# Patient Record
Sex: Male | Born: 1975 | Race: White | Hispanic: No | Marital: Single | Smoking: Former smoker
Health system: Southern US, Community
[De-identification: ages and names within clinical notes are randomized; demographics above are authoritative.]

## PROBLEM LIST (undated history)

## (undated) DIAGNOSIS — F39 Unspecified mood [affective] disorder: Secondary | ICD-10-CM

## (undated) DIAGNOSIS — M1990 Unspecified osteoarthritis, unspecified site: Secondary | ICD-10-CM

## (undated) HISTORY — DX: Unspecified mood (affective) disorder: F39

## (undated) HISTORY — PX: OTHER SURGICAL HISTORY: SHX169

## (undated) HISTORY — PX: TONSILLECTOMY: SUR1361

---

## 1998-01-24 ENCOUNTER — Other Ambulatory Visit: Admission: RE | Admit: 1998-01-24 | Discharge: 1998-01-24 | Payer: Self-pay | Admitting: *Deleted

## 2005-01-28 ENCOUNTER — Emergency Department (HOSPITAL_COMMUNITY): Admission: EM | Admit: 2005-01-28 | Discharge: 2005-01-28 | Payer: Self-pay | Admitting: Emergency Medicine

## 2010-09-10 ENCOUNTER — Other Ambulatory Visit (HOSPITAL_COMMUNITY)
Admission: RE | Admit: 2010-09-10 | Discharge: 2010-09-10 | Disposition: A | Discharge status: Home / Self Care | Payer: Self-pay | Source: Ambulatory Visit | Attending: Urology | Admitting: Urology

## 2010-09-10 ENCOUNTER — Other Ambulatory Visit: Payer: Self-pay | Admitting: Urology

## 2010-09-10 DIAGNOSIS — Z302 Encounter for sterilization: Secondary | ICD-10-CM | POA: Insufficient documentation

## 2019-07-14 ENCOUNTER — Encounter: Payer: Self-pay | Admitting: Gastroenterology

## 2019-08-11 ENCOUNTER — Ambulatory Visit: Payer: BC Managed Care – PPO | Admitting: Gastroenterology

## 2019-09-09 ENCOUNTER — Ambulatory Visit: Payer: BC Managed Care – PPO | Admitting: Gastroenterology

## 2019-09-09 ENCOUNTER — Encounter: Payer: Self-pay | Admitting: Gastroenterology

## 2019-09-09 ENCOUNTER — Other Ambulatory Visit: Payer: Self-pay

## 2019-09-09 DIAGNOSIS — R1013 Epigastric pain: Secondary | ICD-10-CM

## 2019-09-09 DIAGNOSIS — R0989 Other specified symptoms and signs involving the circulatory and respiratory systems: Secondary | ICD-10-CM | POA: Insufficient documentation

## 2019-09-09 DIAGNOSIS — R1310 Dysphagia, unspecified: Secondary | ICD-10-CM | POA: Insufficient documentation

## 2019-09-09 NOTE — Progress Notes (Signed)
Primary Care Physician:  Harold Weaver, [place] Primary Gastroenterologist:  Dr. Gala Weaver   Chief Complaint  Patient presents with  . Abdominal Pain    Sharp pain on left side through stomach and goes towards chest,trouble swallowing    HPI:   Harold Weaver is a 44 y.o. male presenting today at the request of Harold Begin, FNP, due to abdominal pain and dysphagia.   He reports chronic LUQ pain, GERD, and solid food/pill dysphagia for several years. No N/V. No regurgitation. LUQ pain underlying, waxing and waning. No specific postprandial relation. Has been on Protonix chronically but not taking on an empty stomach. Drinks energy drinks and sodas.   Night shift for 3 years. Works at [employer]. Threw everything off for him. Eats before taking medication. Never tried Prilosec or Nexium. Will take 800 mg Ibuprofen as needed for headaches. About once a week. No melena or hematochezia. Good appetite. No weight loss. Feels like he has to clear his throat all the time. Voice hoarseness. Feels like something is in his throat all the time. Throat clearing.   No constipation or diarrhea.   Past Medical History:  Diagnosis Date  . Unspecified mood (affective) disorder [facility])     Past Surgical History:  Procedure Laterality Date  . None      Current Outpatient Medications  Medication Sig Dispense Refill  . Cholecalciferol (HM VITAMIN D3) 50 MCG (2000 UT) CAPS Take by mouth daily.    . divalproex (DEPAKOTE) 250 MG DR tablet Take 500 mg by mouth daily.    . fluticasone (FLONASE) 50 MCG/ACT nasal spray Place into both nostrils daily.    . pantoprazole (PROTONIX) 40 MG tablet Take 40 mg by mouth daily.     No current facility-administered medications for this visit.    Allergies as of 09/09/2019  . (No Known Allergies)    Family History  Problem Relation Age of Onset  . Colon cancer Neg Hx   . Colon polyps Neg Hx     Social History   Socioeconomic History  . Marital status:  Married    Spouse name: Not on file  . Number of children: Not on file  . Years of education: Not on file  . Highest education level: Not on file  Occupational History  . Not on file  Tobacco Use  . Smoking status: Former Research scientist (life sciences)  . Smokeless tobacco: Current User  . Tobacco comment: vapes  Substance and Sexual Activity  . Alcohol use: Yes    Comment: 12 pack every other weekend, possibly a few during the week   . Drug use: Never  . Sexual activity: Not on file  Other Topics Concern  . Not on file  Social History Narrative  . Not on file   Social Determinants of Health   Financial Resource Strain:   . Difficulty of Paying Living Expenses: Not on file  Food Insecurity:   . Worried About Charity fundraiser in the Last Year: Not on file  . Ran Out of Food in the Last Year: Not on file  Transportation Needs:   . Lack of Transportation (Medical): Not on file  . Lack of Transportation (Non-Medical): Not on file  Physical Activity:   . Days of Exercise per Week: Not on file  . Minutes of Exercise per Session: Not on file  Stress:   . Feeling of Stress : Not on file  Social Connections:   . Frequency of Communication with Friends and Family:  Not on file  . Frequency of Social Gatherings with Friends and Family: Not on file  . Attends Religious Services: Not on file  . Active Member of Clubs or Organizations: Not on file  . Attends Banker Meetings: Not on file  . Marital Status: Not on file  Intimate Partner Violence:   . Fear of Current or Ex-Partner: Not on file  . Emotionally Abused: Not on file  . Physically Abused: Not on file  . Sexually Abused: Not on file    Review of Systems: Gen: Denies any fever, chills, fatigue, weight loss, lack of appetite.  CV: Denies chest pain, heart palpitations, peripheral edema, syncope.  Resp: Denies shortness of breath at rest or with exertion. Denies wheezing or cough.  GI: see HPI GU : Denies urinary burning, urinary  frequency, urinary hesitancy MS: Denies joint pain, muscle weakness, cramps, or limitation of movement.  Derm: Denies rash, itching, dry skin Psych: Denies depression, anxiety, memory loss, and confusion Heme: Denies bruising, bleeding, and enlarged lymph nodes.  Physical Exam: BP 117/73   Pulse 74   Temp (!) 97.5 F (36.4 C) (Temporal)   Ht 6\' 2"  (1.88 m)   Wt 202 lb 12.8 oz (92 kg)   BMI 26.04 kg/m  General:   Alert and oriented. Pleasant and cooperative. Well-nourished and well-developed.  Head:  Normocephalic and atraumatic. Eyes:  Without icterus, sclera clear and conjunctiva pink.  Ears:  Normal auditory acuity. Lungs:  Clear to auscultation bilaterally. No wheezes, rales, or rhonchi. No distress.  Heart:  S1, S2 present without murmurs appreciated.  Abdomen:  +BS, soft, mild LUQ tenderness with palpation, and non-distended. No HSM noted. No guarding or rebound. No masses appreciated.  Rectal:  Deferred  Msk:  Symmetrical without gross deformities. Normal posture. Extremities:  Without edema. Neurologic:  Alert and  oriented x4;  grossly normal neurologically. Psych:  Alert and cooperative. Normal mood and affect.  ASSESSMENT: Harold Weaver is a very pleasant 44 y.o. male presenting today with chronic LUQ pain, globus sensation, solid food and liquid dysphagia in setting of chronic GERD. Likely dealing with element of LPR as well with reported symptoms. No prior EGD. Ibuprofen taken approximately weekly. Needs EGD with dilation in future, aggressive dietary changes, and increasing PPI to BID for now.   PLAN:  Proceed with upper endoscopy/dilation in the near future with Dr. 55 using Propofol. The risks, benefits, and alternatives have been discussed in detail with patient. They have stated understanding and desire to proceed.   Increase Protonix to BID.  I have also given Dexilant samples to try in case Protonix is not helpful in the next few weeks  GERD diet  provided: avoid energy drinks, sodas, etc.   Needs to avoid NSAIDs.   Routine follow-up in 3-4 months   Jena Gauss, PhD, [facility]enter[facility]

## 2019-09-09 NOTE — Patient Instructions (Addendum)
For now, increase Protonix to twice a day. Make sure to take it 30 minutes before meals twice a day on an empty stomach, as it is best absorbed this way.  We are arranging an upper endoscopy with dilation by Dr. Jena Gauss in the near future.  We will see you back in 3-4 months!  Please review the reflux diet handout, as a lot of these things listed can worsen symptoms.  It was a pleasure to see you today. I want to create trusting relationships with patients to provide genuine, compassionate, and quality care. I value your feedback. If you receive a survey regarding your visit,  I greatly appreciate you taking time to fill this out.   Gelene Mink, PhD, ANP-BC [facility] Gastroenterology    Food Choices for Gastroesophageal Reflux Disease, Adult When you have gastroesophageal reflux disease (GERD), the foods you eat and your eating habits are very important. Choosing the right foods can help ease the discomfort of GERD. Consider working with a diet and nutrition specialist (dietitian) to help you make healthy food choices. What general guidelines should I follow?  Eating plan  Choose healthy foods low in fat, such as fruits, vegetables, whole grains, low-fat dairy products, and lean meat, fish, and poultry.  Eat frequent, small meals instead of three large meals each day. Eat your meals slowly, in a relaxed setting. Avoid bending over or lying down until 2-3 hours after eating.  Limit high-fat foods such as fatty meats or fried foods.  Limit your intake of oils, butter, and shortening to less than 8 teaspoons each day.  Avoid the following: ? Foods that cause symptoms. These may be different for different people. Keep a food diary to keep track of foods that cause symptoms. ? Alcohol. ? Drinking large amounts of liquid with meals. ? Eating meals during the 2-3 hours before bed.  Cook foods using methods other than frying. This may include baking, grilling, or  broiling. Lifestyle  Maintain a healthy weight. Ask your health care provider what weight is healthy for you. If you need to lose weight, work with your health care provider to do so safely.  Exercise for at least 30 minutes on 5 or more days each week, or as told by your health care provider.  Avoid wearing clothes that fit tightly around your waist and chest.  Do not use any products that contain nicotine or tobacco, such as cigarettes and e-cigarettes. If you need help quitting, ask your health care provider.  Sleep with the head of your bed raised. Use a wedge under the mattress or blocks under the bed frame to raise the head of the bed. What foods are not recommended? The items listed may not be a complete list. Talk with your dietitian about what dietary choices are best for you. Grains Pastries or quick breads with added fat. [place] toast. Vegetables Deep fried vegetables. [place] fries. Any vegetables prepared with added fat. Any vegetables that cause symptoms. For some people this may include tomatoes and tomato products, chili peppers, onions and garlic, and horseradish. Fruits Any fruits prepared with added fat. Any fruits that cause symptoms. For some people this may include citrus fruits, such as oranges, grapefruit, pineapple, and lemons. Meats and other protein foods High-fat meats, such as fatty beef or pork, hot dogs, ribs, ham, sausage, salami and bacon. Fried meat or protein, including fried fish and fried chicken. Nuts and nut butters. Dairy Whole milk and chocolate milk. Sour cream. Cream. Ice cream.  Cream cheese. Milk shakes. Beverages Coffee and tea, with or without caffeine. Carbonated beverages. Sodas. Energy drinks. Fruit juice made with acidic fruits (such as orange or grapefruit). Tomato juice. Alcoholic drinks. Fats and oils Butter. Margarine. Shortening. Ghee. Sweets and desserts Chocolate and cocoa. Donuts. Seasoning and other foods Pepper. Peppermint and  spearmint. Any condiments, herbs, or seasonings that cause symptoms. For some people, this may include curry, hot sauce, or vinegar-based salad dressings. Summary  When you have gastroesophageal reflux disease (GERD), food and lifestyle choices are very important to help ease the discomfort of GERD.  Eat frequent, small meals instead of three large meals each day. Eat your meals slowly, in a relaxed setting. Avoid bending over or lying down until 2-3 hours after eating.  Limit high-fat foods such as fatty meat or fried foods. This information is not intended to replace advice given to you by your health care provider. Make sure you discuss any questions you have with your health care provider. Document Revised: 11/11/2018 Document Reviewed: 07/22/2016 Elsevier Patient Education  [place].

## 2019-09-12 NOTE — Progress Notes (Signed)
Cc'ed to pcp °

## 2019-09-29 ENCOUNTER — Encounter (INDEPENDENT_AMBULATORY_CARE_PROVIDER_SITE_OTHER): Payer: Self-pay | Admitting: Otolaryngology

## 2019-09-29 ENCOUNTER — Ambulatory Visit (INDEPENDENT_AMBULATORY_CARE_PROVIDER_SITE_OTHER): Payer: BC Managed Care – PPO | Admitting: Otolaryngology

## 2019-09-29 ENCOUNTER — Other Ambulatory Visit: Payer: Self-pay

## 2019-09-29 VITALS — Temp 97.9°F

## 2019-09-29 DIAGNOSIS — J310 Chronic rhinitis: Secondary | ICD-10-CM

## 2019-09-29 DIAGNOSIS — J329 Chronic sinusitis, unspecified: Secondary | ICD-10-CM

## 2019-09-29 DIAGNOSIS — J342 Deviated nasal septum: Secondary | ICD-10-CM | POA: Diagnosis not present

## 2019-09-29 NOTE — Progress Notes (Signed)
HPI: Harold Weaver is a 44 y.o. male who presents for evaluation of chronic nasal obstruction with septal deviation.  Patient states that he always has trouble breathing through his nose especially the right side worse than the left.  He also describes headaches and pressure in the face at times.  His fiance tells him that he snores.  He has not had any x-rays of his sinuses. He is also being evaluated for headaches in the back of his neck with MRI scan of his neck. He is also having upper GI endoscopy because of chronic throat clearing and food getting caught in his throat which is scheduled in April.  Past Medical History:  Diagnosis Date  . Unspecified mood (affective) disorder [facility])    Past Surgical History:  Procedure Laterality Date  . None     Social History   Socioeconomic History  . Marital status: Married    Spouse name: Not on file  . Number of children: Not on file  . Years of education: Not on file  . Highest education level: Not on file  Occupational History  . Not on file  Tobacco Use  . Smoking status: Former Smoker    Packs/day: 1.00    Years: 17.00    Pack years: 17.00    Types: Cigarettes    Start date: 1    Quit date: 2020    Years since quitting: 1.1  . Smokeless tobacco: Current User  . Tobacco comment: vapes  Substance and Sexual Activity  . Alcohol use: Yes    Comment: 12 pack every other weekend, possibly a few during the week   . Drug use: Never  . Sexual activity: Not on file  Other Topics Concern  . Not on file  Social History Narrative  . Not on file   Social Determinants of Health   Financial Resource Strain:   . Difficulty of Paying Living Expenses: Not on file  Food Insecurity:   . Worried About Charity fundraiser in the Last Year: Not on file  . Ran Out of Food in the Last Year: Not on file  Transportation Needs:   . Lack of Transportation (Medical): Not on file  . Lack of Transportation (Non-Medical): Not on file   Physical Activity:   . Days of Exercise per Week: Not on file  . Minutes of Exercise per Session: Not on file  Stress:   . Feeling of Stress : Not on file  Social Connections:   . Frequency of Communication with Friends and Family: Not on file  . Frequency of Social Gatherings with Friends and Family: Not on file  . Attends Religious Services: Not on file  . Active Member of Clubs or Organizations: Not on file  . Attends Archivist Meetings: Not on file  . Marital Status: Not on file   Family History  Problem Relation Age of Onset  . Colon cancer Neg Hx   . Colon polyps Neg Hx    No Known Allergies Prior to Admission medications   Medication Sig Start Date End Date Taking? Authorizing Provider  Cholecalciferol (HM VITAMIN D3) 50 MCG (2000 UT) CAPS Take by mouth daily.   Yes [provider]  divalproex (DEPAKOTE) 250 MG DR tablet Take 500 mg by mouth daily.   Yes [provider]  fluticasone (FLONASE) 50 MCG/ACT nasal spray Place into both nostrils daily.   Yes [provider]  pantoprazole (PROTONIX) 40 MG tablet Take 40 mg by  mouth daily.   Yes [provider]     Positive ROS: Otherwise negative  All other systems have been reviewed and were otherwise negative with the exception of those mentioned in the HPI and as above.  Physical Exam: Constitutional: Alert, well-appearing, no acute distress Ears: External ears without lesions or tenderness. Ear canals are clear bilaterally with intact, clear TMs.  Nasal: External nose without lesions. Septum deviated to the right posteriorly with a concavity anteriorly on the right.  Moderate rhinitis..  Nasal endoscopy was performed bilaterally and on nasal endoscopy both middle meatus regions were clear with no obvious mucopurulent discharge.  No polyps noted.  Nasopharynx was clear. Oral: Lips and gums without lesions. Tongue and palate mucosa without lesions. Posterior oropharynx clear.   Patient status post tonsillectomy Neck: No palpable adenopathy or masses Respiratory: Breathing comfortably  Skin: No facial/neck lesions or rash noted.  Nasal/sinus endoscopy  Date/Time: 09/29/2019 5:02 PM Performed by: Drema Halon, MD Authorized by: Drema Halon, MD   Consent:    Consent obtained:  Verbal   Consent given by:  Patient   Risks discussed:  Pain Procedure details:    Indications: sino-nasal symptoms     Instrument: flexible fiberoptic nasal endoscope     Scope location: bilateral nare   Septum:    Deviation: deviated to the right and posterior deviation     Severity of deviation: intermediate   Sinus:    Right middle meatus: normal     Left middle meatus: normal     Right nasopharynx: normal     Left nasopharynx: normal   Comments:     Both middle meatus regions were clear with no signs of acute infection.  Minimal edema.    Assessment: Septal deviation with nasal obstruction "Sinus pressure" and headache questionable sinus disease.  Plan: We will plan on obtaining a CT scan of the sinuses prior to consideration of surgery to evaluate sinuses.  Patient will follow up here following the CT scan of the sinuses to review this and discuss surgery.  Narda Bonds, MD

## 2019-09-30 ENCOUNTER — Other Ambulatory Visit (INDEPENDENT_AMBULATORY_CARE_PROVIDER_SITE_OTHER): Payer: Self-pay

## 2019-09-30 DIAGNOSIS — J329 Chronic sinusitis, unspecified: Secondary | ICD-10-CM

## 2019-10-03 ENCOUNTER — Telehealth: Payer: Self-pay | Admitting: *Deleted

## 2019-10-03 NOTE — Telephone Encounter (Signed)
Checked AIM website for PA and none is required.

## 2019-10-12 ENCOUNTER — Ambulatory Visit
Admission: RE | Admit: 2019-10-12 | Discharge: 2019-10-12 | Disposition: A | Discharge status: Home / Self Care | Payer: BC Managed Care – PPO | Source: Ambulatory Visit | Attending: Otolaryngology | Admitting: Otolaryngology

## 2019-10-12 DIAGNOSIS — J329 Chronic sinusitis, unspecified: Secondary | ICD-10-CM

## 2019-10-26 ENCOUNTER — Ambulatory Visit (INDEPENDENT_AMBULATORY_CARE_PROVIDER_SITE_OTHER): Payer: BC Managed Care – PPO | Admitting: Otolaryngology

## 2019-11-22 NOTE — Patient Instructions (Signed)
Harold Weaver  11/22/2019     @PREFPERIOPPHARMACY @   Your procedure is scheduled on  11/24/2019 .  Report to 11/26/2019 at  0830  A.M.  Call this number if you have problems the morning of surgery:  [phone]   Remember: Follow the diet instructions given to you by Dr [phone] office.                      Take these medicines the morning of surgery with A SIP OF WATER  Depakote, priotonix.    Do not wear jewelry, make-up or nail polish.  Do not wear lotions, powders, or perfumes. Please wear deodorant and brush your teeth.  Do not shave 48 hours prior to surgery.  Men may shave face and neck.  Do not bring valuables to the hospital.  [facility] is not responsible for any belongings or valuables.  Contacts, dentures or bridgework may not be worn into surgery.  Leave your suitcase in the car.  After surgery it may be brought to your room.  For patients admitted to the hospital, discharge time will be determined by your treatment team.  Patients discharged the day of surgery will not be allowed to drive home.   Name and phone number of your driver:   family Special instructions:  DO NOT smoke the morning of your procedure.  Please read over the following fact sheets that you were given. Anesthesia Post-op Instructions and Care and Recovery After Surgery       Upper Endoscopy, Adult, Care After This sheet gives you information about how to care for yourself after your procedure. Your health care provider may also give you more specific instructions. If you have problems or questions, contact your health care provider. What can I expect after the procedure? After the procedure, it is common to have:  A sore throat.  Mild stomach pain or discomfort.  Bloating.  Nausea. Follow these instructions at home:   Follow instructions from your health care provider about what to eat or drink after your procedure.  Return to your normal activities as told by  your health care provider. Ask your health care provider what activities are safe for you.  Take over-the-counter and prescription medicines only as told by your health care provider.  Do not drive for 24 hours if you were given a sedative during your procedure.  Keep all follow-up visits as told by your health care provider. This is important. Contact a health care provider if you have:  A sore throat that lasts longer than one day.  Trouble swallowing. Get help right away if:  You vomit blood or your vomit looks like coffee grounds.  You have: ? A fever. ? Bloody, black, or tarry stools. ? A severe sore throat or you cannot swallow. ? Difficulty breathing. ? Severe pain in your chest or abdomen. Summary  After the procedure, it is common to have a sore throat, mild stomach discomfort, bloating, and nausea.  Do not drive for 24 hours if you were given a sedative during the procedure.  Follow instructions from your health care provider about what to eat or drink after your procedure.  Return to your normal activities as told by your health care provider. This information is not intended to replace advice given to you by your health care provider. Make sure you discuss any questions you have with your health care provider. Document Revised: 01/12/2018  Document Reviewed: 12/21/2017 Elsevier Patient Education  [place].  Esophageal Dilatation Esophageal dilatation, also called esophageal dilation, is a procedure to widen or open (dilate) a blocked or narrowed part of the esophagus. The esophagus is the part of the body that moves food and liquid from the mouth to the stomach. You may need this procedure if:  You have a buildup of scar tissue in your esophagus that makes it difficult, painful, or impossible to swallow. This can be caused by gastroesophageal reflux disease (GERD).  You have cancer of the esophagus.  There is a problem with how food moves through your  esophagus. In some cases, you may need this procedure repeated at a later time to dilate the esophagus gradually. Tell a health care provider about:  Any allergies you have.  All medicines you are taking, including vitamins, herbs, eye drops, creams, and over-the-counter medicines.  Any problems you or family members have had with anesthetic medicines.  Any blood disorders you have.  Any surgeries you have had.  Any medical conditions you have.  Any antibiotic medicines you are required to take before dental procedures.  Whether you are pregnant or may be pregnant. What are the risks? Generally, this is a safe procedure. However, problems may occur, including:  Bleeding due to a tear in the lining of the esophagus.  A hole (perforation) in the esophagus. What happens before the procedure?  Follow instructions from your health care provider about eating or drinking restrictions.  Ask your health care provider about changing or stopping your regular medicines. This is especially important if you are taking diabetes medicines or blood thinners.  Plan to have someone take you home from the hospital or clinic.  Plan to have a responsible adult care for you for at least 24 hours after you leave the hospital or clinic. This is important. What happens during the procedure?  You may be given a medicine to help you relax (sedative).  A numbing medicine may be sprayed into the back of your throat, or you may gargle the medicine.  Your health care provider may perform the dilatation using various surgical instruments, such as: ? Simple dilators. This instrument is carefully placed in the esophagus to stretch it. ? Guided wire bougies. This involves using an endoscope to insert a wire into the esophagus. A dilator is passed over this wire to enlarge the esophagus. Then the wire is removed. ? Balloon dilators. An endoscope with a small balloon at the end is inserted into the esophagus.  The balloon is inflated to stretch the esophagus and open it up. The procedure may vary among health care providers and hospitals. What happens after the procedure?  Your blood pressure, heart rate, breathing rate, and blood oxygen level will be monitored until the medicines you were given have worn off.  Your throat may feel slightly sore and numb. This will improve slowly over time.  You will not be allowed to eat or drink until your throat is no longer numb.  When you are able to drink, urinate, and sit on the edge of the bed without nausea or dizziness, you may be able to return home. Follow these instructions at home:  Take over-the-counter and prescription medicines only as told by your health care provider.  Do not drive for 24 hours if you were given a sedative during your procedure.  You should have a responsible adult with you for 24 hours after the procedure.  Follow instructions from your  health care provider about any eating or drinking restrictions.  Do not use any products that contain nicotine or tobacco, such as cigarettes and e-cigarettes. If you need help quitting, ask your health care provider.  Keep all follow-up visits as told by your health care provider. This is important. Get help right away if you:  Have a fever.  Have chest pain.  Have pain that is not relieved by medication.  Have trouble breathing.  Have trouble swallowing.  Vomit blood. Summary  Esophageal dilatation, also called esophageal dilation, is a procedure to widen or open (dilate) a blocked or narrowed part of the esophagus.  Plan to have someone take you home from the hospital or clinic.  For this procedure, a numbing medicine may be sprayed into the back of your throat, or you may gargle the medicine.  Do not drive for 24 hours if you were given a sedative during your procedure. This information is not intended to replace advice given to you by your health care provider. Make  sure you discuss any questions you have with your health care provider. Document Revised: 05/18/2019 Document Reviewed: 05/26/2017 Elsevier Patient Education  2020 [place] After These instructions provide you with information about caring for yourself after your procedure. Your health care provider may also give you more specific instructions. Your treatment has been planned according to current medical practices, but problems sometimes occur. Call your health care provider if you have any problems or questions after your procedure. What can I expect after the procedure? After your procedure, you may:  Feel sleepy for several hours.  Feel clumsy and have poor balance for several hours.  Feel forgetful about what happened after the procedure.  Have poor judgment for several hours.  Feel nauseous or vomit.  Have a sore throat if you had a breathing tube during the procedure. Follow these instructions at home: For at least 24 hours after the procedure:      Have a responsible adult stay with you. It is important to have someone help care for you until you are awake and alert.  Rest as needed.  Do not: ? Participate in activities in which you could fall or become injured. ? Drive. ? Use heavy machinery. ? Drink alcohol. ? Take sleeping pills or medicines that cause drowsiness. ? Make important decisions or sign legal documents. ? Take care of children on your own. Eating and drinking  Follow the diet that is recommended by your health care provider.  If you vomit, drink water, juice, or soup when you can drink without vomiting.  Make sure you have little or no nausea before eating solid foods. General instructions  Take over-the-counter and prescription medicines only as told by your health care provider.  If you have sleep apnea, surgery and certain medicines can increase your risk for breathing problems. Follow instructions from  your health care provider about wearing your sleep device: ? Anytime you are sleeping, including during daytime naps. ? While taking prescription pain medicines, sleeping medicines, or medicines that make you drowsy.  If you smoke, do not smoke without supervision.  Keep all follow-up visits as told by your health care provider. This is important. Contact a health care provider if:  You keep feeling nauseous or you keep vomiting.  You feel light-headed.  You develop a rash.  You have a fever. Get help right away if:  You have trouble breathing. Summary  For several hours after your procedure,  you may feel sleepy and have poor judgment.  Have a responsible adult stay with you for at least 24 hours or until you are awake and alert. This information is not intended to replace advice given to you by your health care provider. Make sure you discuss any questions you have with your health care provider. Document Revised: 10/19/2017 Document Reviewed: 11/11/2015 Elsevier Patient Education  [place].

## 2019-11-23 ENCOUNTER — Other Ambulatory Visit (HOSPITAL_COMMUNITY)
Admission: RE | Admit: 2019-11-23 | Discharge: 2019-11-23 | Disposition: A | Discharge status: Home / Self Care | Payer: BC Managed Care – PPO | Source: Ambulatory Visit | Attending: Internal Medicine | Admitting: Internal Medicine

## 2019-11-23 ENCOUNTER — Encounter (HOSPITAL_COMMUNITY): Payer: Self-pay

## 2019-11-23 ENCOUNTER — Other Ambulatory Visit: Payer: Self-pay

## 2019-11-23 ENCOUNTER — Encounter (HOSPITAL_COMMUNITY)
Admission: RE | Admit: 2019-11-23 | Discharge: 2019-11-23 | Disposition: A | Discharge status: Home / Self Care | Payer: BC Managed Care – PPO | Source: Ambulatory Visit | Attending: Internal Medicine | Admitting: Internal Medicine

## 2019-11-23 ENCOUNTER — Other Ambulatory Visit (HOSPITAL_COMMUNITY): Payer: BC Managed Care – PPO

## 2019-11-23 DIAGNOSIS — Z20822 Contact with and (suspected) exposure to covid-19: Secondary | ICD-10-CM | POA: Insufficient documentation

## 2019-11-23 DIAGNOSIS — Z01812 Encounter for preprocedural laboratory examination: Secondary | ICD-10-CM | POA: Insufficient documentation

## 2019-11-23 HISTORY — DX: Unspecified osteoarthritis, unspecified site: M19.90

## 2019-11-23 LAB — SARS CORONAVIRUS 2 (TAT 6-24 HRS): SARS Coronavirus 2: NEGATIVE

## 2019-11-24 ENCOUNTER — Other Ambulatory Visit: Payer: Self-pay

## 2019-11-24 ENCOUNTER — Ambulatory Visit (HOSPITAL_COMMUNITY): Payer: BC Managed Care – PPO | Admitting: Anesthesiology

## 2019-11-24 ENCOUNTER — Ambulatory Visit (HOSPITAL_COMMUNITY)
Admission: RE | Admit: 2019-11-24 | Discharge: 2019-11-24 | Disposition: A | Discharge status: Home / Self Care | Payer: BC Managed Care – PPO | Attending: Internal Medicine | Admitting: Internal Medicine

## 2019-11-24 ENCOUNTER — Encounter (HOSPITAL_COMMUNITY)
Admission: RE | Disposition: A | Discharge status: Home / Self Care | Payer: Self-pay | Source: Home / Self Care | Attending: Internal Medicine

## 2019-11-24 ENCOUNTER — Encounter (HOSPITAL_COMMUNITY): Payer: Self-pay | Admitting: Internal Medicine

## 2019-11-24 DIAGNOSIS — Z79899 Other long term (current) drug therapy: Secondary | ICD-10-CM | POA: Diagnosis not present

## 2019-11-24 DIAGNOSIS — K209 Esophagitis, unspecified without bleeding: Secondary | ICD-10-CM | POA: Diagnosis not present

## 2019-11-24 DIAGNOSIS — Z87891 Personal history of nicotine dependence: Secondary | ICD-10-CM | POA: Insufficient documentation

## 2019-11-24 DIAGNOSIS — R1310 Dysphagia, unspecified: Secondary | ICD-10-CM | POA: Diagnosis not present

## 2019-11-24 DIAGNOSIS — R1314 Dysphagia, pharyngoesophageal phase: Secondary | ICD-10-CM | POA: Diagnosis not present

## 2019-11-24 DIAGNOSIS — M1990 Unspecified osteoarthritis, unspecified site: Secondary | ICD-10-CM | POA: Diagnosis not present

## 2019-11-24 DIAGNOSIS — F39 Unspecified mood [affective] disorder: Secondary | ICD-10-CM | POA: Insufficient documentation

## 2019-11-24 DIAGNOSIS — K259 Gastric ulcer, unspecified as acute or chronic, without hemorrhage or perforation: Secondary | ICD-10-CM | POA: Diagnosis not present

## 2019-11-24 DIAGNOSIS — K2100 Gastro-esophageal reflux disease with esophagitis, without bleeding: Secondary | ICD-10-CM | POA: Diagnosis not present

## 2019-11-24 HISTORY — PX: MALONEY DILATION: SHX5535

## 2019-11-24 HISTORY — PX: ESOPHAGOGASTRODUODENOSCOPY (EGD) WITH PROPOFOL: SHX5813

## 2019-11-24 SURGERY — ESOPHAGOGASTRODUODENOSCOPY (EGD) WITH PROPOFOL
Anesthesia: General

## 2019-11-24 MED ORDER — PROPOFOL 10 MG/ML IV BOLUS
INTRAVENOUS | Status: DC | PRN
  Administered 2019-11-24: 40 mg via INTRAVENOUS
  Administered 2019-11-24: 60 mg via INTRAVENOUS
  Administered 2019-11-24: 20 mg via INTRAVENOUS

## 2019-11-24 MED ORDER — PROPOFOL 500 MG/50ML IV EMUL
INTRAVENOUS | Status: DC | PRN
  Administered 2019-11-24: 200 ug/kg/min via INTRAVENOUS

## 2019-11-24 MED ORDER — GLYCOPYRROLATE 0.2 MG/ML IJ SOLN
INTRAMUSCULAR | Status: DC | PRN
  Administered 2019-11-24: .2 mg via INTRAVENOUS

## 2019-11-24 MED ORDER — CHLORHEXIDINE GLUCONATE CLOTH 2 % EX PADS: 6.0000 | MEDICATED_PAD | Freq: Once | CUTANEOUS | Status: DC

## 2019-11-24 MED ORDER — PROPOFOL 10 MG/ML IV BOLUS
INTRAVENOUS | Status: AC
  Filled 2019-11-24: qty 40

## 2019-11-24 MED ORDER — LIDOCAINE HCL (CARDIAC) PF 50 MG/5ML IV SOSY
PREFILLED_SYRINGE | INTRAVENOUS | Status: DC | PRN
  Administered 2019-11-24: 100 mg via INTRAVENOUS

## 2019-11-24 MED ORDER — PROPOFOL 10 MG/ML IV BOLUS
INTRAVENOUS | Status: AC
  Filled 2019-11-24: qty 60

## 2019-11-24 MED ORDER — STERILE WATER FOR IRRIGATION IR SOLN
Status: DC | PRN
  Administered 2019-11-24: 100 mL

## 2019-11-24 MED ORDER — LACTATED RINGERS IV SOLN
INTRAVENOUS | Status: DC
  Administered 2019-11-24: 1000 mL via INTRAVENOUS

## 2019-11-24 MED ORDER — GLYCOPYRROLATE PF 0.2 MG/ML IJ SOSY
PREFILLED_SYRINGE | INTRAMUSCULAR | Status: AC
  Filled 2019-11-24: qty 1

## 2019-11-24 MED ORDER — LIDOCAINE 2% (20 MG/ML) 5 ML SYRINGE
INTRAMUSCULAR | Status: AC
  Filled 2019-11-24: qty 20

## 2019-11-24 NOTE — Anesthesia Postprocedure Evaluation (Signed)
Anesthesia Post Note  Patient: [name]  Procedure(s) Performed: ESOPHAGOGASTRODUODENOSCOPY (EGD) WITH PROPOFOL (N/A ) MALONEY DILATION (N/A )  Patient location during evaluation: PACU Anesthesia Type: General Level of consciousness: awake and alert and patient cooperative Pain management: satisfactory to patient Vital Signs Assessment: post-procedure vital signs reviewed and stable Respiratory status: spontaneous breathing Cardiovascular status: stable Postop Assessment: no apparent nausea or vomiting Anesthetic complications: no     Last Vitals:  Vitals:   11/24/19 1043 11/24/19 1045  BP: (!) 92/54 (!) 88/56  Pulse: 60 60  Resp: 12 19  Temp: (!) 36.4 C   SpO2: 99%     Last Pain:  Vitals:   11/24/19 1055  TempSrc:   PainSc: 0-No pain                 Blessyn [place]

## 2019-11-24 NOTE — Anesthesia Procedure Notes (Signed)
Date/Time: 11/24/2019 10:22 AM Performed by: Franco Nones, CRNA Pre-anesthesia Checklist: Patient identified, Emergency Drugs available, Suction available, Timeout performed and Patient being monitored Patient Re-evaluated:Patient Re-evaluated prior to induction Oxygen Delivery Method: Nasal Cannula

## 2019-11-24 NOTE — Transfer of Care (Signed)
Immediate Anesthesia Transfer of Care Note  Patient: Harold Weaver  Procedure(s) Performed: ESOPHAGOGASTRODUODENOSCOPY (EGD) WITH PROPOFOL (N/A ) MALONEY DILATION (N/A )  Patient Location: PACU  Anesthesia Type:General  Level of Consciousness: sedated and patient cooperative  Airway & Oxygen Therapy: Patient Spontanous Breathing and Patient connected to nasal cannula oxygen  Post-op Assessment: Report given to RN and Post -op Vital signs reviewed and stable  Post vital signs: Reviewed and stable  Last Vitals:  Vitals Value Taken Time  BP 92/54 11/24/19 1041  Temp 97.5   Pulse 64 11/24/19 1042  Resp 16 11/24/19 1042  SpO2 95 % 11/24/19 1042  Vitals shown include unvalidated device data.  Last Pain:  Vitals:   11/24/19 1024  TempSrc:   PainSc: 0-No pain         Complications: No apparent anesthesia complications

## 2019-11-24 NOTE — Discharge Instructions (Signed)
EGD Discharge instructions Please read the instructions outlined below and refer to this sheet in the next few weeks. These discharge instructions provide you with general information on caring for yourself after you leave the hospital. Your doctor may also give you specific instructions. While your treatment has been planned according to the most current medical practices available, unavoidable complications occasionally occur. If you have any problems or questions after discharge, please call your doctor. ACTIVITY  You may resume your regular activity but move at a slower pace for the next 24 hours.   Take frequent rest periods for the next 24 hours.   Walking will help expel (get rid of) the air and reduce the bloated feeling in your abdomen.   No driving for 24 hours (because of the anesthesia (medicine) used during the test).   You may shower.   Do not sign any important legal documents or operate any machinery for 24 hours (because of the anesthesia used during the test).  NUTRITION  Drink plenty of fluids.   You may resume your normal diet.   Begin with a light meal and progress to your normal diet.   Avoid alcoholic beverages for 24 hours or as instructed by your caregiver.  MEDICATIONS  You may resume your normal medications unless your caregiver tells you otherwise.  WHAT YOU CAN EXPECT TODAY  You may experience abdominal discomfort such as a feeling of fullness or "gas" pains.  FOLLOW-UP  Your doctor will discuss the results of your test with you.  SEEK IMMEDIATE MEDICAL ATTENTION IF ANY OF THE FOLLOWING OCCUR:  Excessive nausea (feeling sick to your stomach) and/or vomiting.   Severe abdominal pain and distention (swelling).   Trouble swallowing.   Temperature over 101 F (37.8 C).   Rectal bleeding or vomiting of blood.     Gastroesophageal Reflux Disease, Adult Gastroesophageal reflux (GER) happens when acid from the stomach flows up into the tube that  connects the mouth and the stomach (esophagus). Normally, food travels down the esophagus and stays in the stomach to be digested. With GER, food and stomach acid sometimes move back up into the esophagus. You may have a disease called gastroesophageal reflux disease (GERD) if the reflux:  Happens often.  Causes frequent or very bad symptoms.  Causes problems such as damage to the esophagus. When this happens, the esophagus becomes sore and swollen (inflamed). Over time, GERD can make small holes (ulcers) in the lining of the esophagus. What are the causes? This condition is caused by a problem with the muscle between the esophagus and the stomach. When this muscle is weak or not normal, it does not close properly to keep food and acid from coming back up from the stomach. The muscle can be weak because of:  Tobacco use.  Pregnancy.  Having a certain type of hernia (hiatal hernia).  Alcohol use.  Certain foods and drinks, such as coffee, chocolate, onions, and peppermint. What increases the risk? You are more likely to develop this condition if you:  Are overweight.  Have a disease that affects your connective tissue.  Use NSAID medicines. What are the signs or symptoms? Symptoms of this condition include:  Heartburn.  Difficult or painful swallowing.  The feeling of having a lump in the throat.  A bitter taste in the mouth.  Bad breath.  Having a lot of saliva.  Having an upset or bloated stomach.  Belching.  Chest pain. Different conditions can cause chest pain. Make sure you  see your doctor if you have chest pain.  Shortness of breath or noisy breathing (wheezing).  Ongoing (chronic) cough or a cough at night.  Wearing away of the surface of teeth (tooth enamel).  Weight loss. How is this treated? Treatment will depend on how bad your symptoms are. Your doctor may suggest:  Changes to your diet.  Medicine.  Surgery. Follow these instructions at  home: Eating and drinking   Follow a diet as told by your doctor. You may need to avoid foods and drinks such as: ? Coffee and tea (with or without caffeine). ? Drinks that contain alcohol. ? Energy drinks and sports drinks. ? Bubbly (carbonated) drinks or sodas. ? Chocolate and cocoa. ? Peppermint and mint flavorings. ? Garlic and onions. ? Horseradish. ? Spicy and acidic foods. These include peppers, chili powder, curry powder, vinegar, hot sauces, and BBQ sauce. ? Citrus fruit juices and citrus fruits, such as oranges, lemons, and limes. ? Tomato-based foods. These include red sauce, chili, salsa, and pizza with red sauce. ? Fried and fatty foods. These include donuts, french fries, potato chips, and high-fat dressings. ? High-fat meats. These include hot dogs, rib eye steak, sausage, ham, and bacon. ? High-fat dairy items, such as whole milk, butter, and cream cheese.  Eat small meals often. Avoid eating large meals.  Avoid drinking large amounts of liquid with your meals.  Avoid eating meals during the 2-3 hours before bedtime.  Avoid lying down right after you eat.  Do not exercise right after you eat. Lifestyle   Do not use any products that contain nicotine or tobacco. These include cigarettes, e-cigarettes, and chewing tobacco. If you need help quitting, ask your doctor.  Try to lower your stress. If you need help doing this, ask your doctor.  If you are overweight, lose an amount of weight that is healthy for you. Ask your doctor about a safe weight loss goal. General instructions  Pay attention to any changes in your symptoms.  Take over-the-counter and prescription medicines only as told by your doctor. Do not take aspirin, ibuprofen, or other NSAIDs unless your doctor says it is okay.  Wear loose clothes. Do not wear anything tight around your waist.  Raise (elevate) the head of your bed about 6 inches (15 cm).  Avoid bending over if this makes your  symptoms worse.  Keep all follow-up visits as told by your doctor. This is important. Contact a doctor if:  You have new symptoms.  You lose weight and you do not know why.  You have trouble swallowing or it hurts to swallow.  You have wheezing or a cough that keeps happening.  Your symptoms do not get better with treatment.  You have a hoarse voice. Get help right away if:  You have pain in your arms, neck, jaw, teeth, or back.  You feel sweaty, dizzy, or light-headed.  You have chest pain or shortness of breath.  You throw up (vomit) and your throw-up looks like blood or coffee grounds.  You pass out (faint).  Your poop (stool) is bloody or black.  You cannot swallow, drink, or eat. Summary  If a person has gastroesophageal reflux disease (GERD), food and stomach acid move back up into the esophagus and cause symptoms or problems such as damage to the esophagus.  Treatment will depend on how bad your symptoms are.  Follow a diet as told by your doctor.  Take all medicines only as told by your doctor. This  information is not intended to replace advice given to you by your health care provider. Make sure you discuss any questions you have with your health care provider. Document Revised: 01/27/2018 Document Reviewed: 01/27/2018 Elsevier Patient Education  [place].   GERD information provided  Stop Protonix.  Begin Dexilant 60 mg once daily before breakfast.  Prescription provided.  Office visit with [place] in 6 weeks  At patient request, I called to [place] at [phone] -got voicemail.

## 2019-11-24 NOTE — H&P (Signed)
@LOGO @   Primary Care Physician:  , FNP Primary Gastroenterologist:  Dr. Truddie Coco  Pre-Procedure History & Physical: HPI:  Harold Weaver is a 44 y.o. male here for further evaluation of longstanding GERD and esophageal dysphagia via EGD.  Increasing Protonix to 40 mg twice daily has not altered symptoms.  Did not try Dexilant.  Past Medical History:  Diagnosis Date  . Arthritis   . Unspecified mood (affective) disorder [facility])     Past Surgical History:  Procedure Laterality Date  . None    . TONSILLECTOMY      Prior to Admission medications   Medication Sig Start Date End Date Taking? Authorizing Provider  Azelastine HCl 0.15 % SOLN Place 1 spray into both nostrils 2 (two) times daily as needed for rhinitis or allergies. Use in each nostril as directed    Yes [provider]  Cholecalciferol (HM VITAMIN D3) 50 MCG (2000 UT) CAPS Take 2,000 Units by mouth daily.    Yes [provider]  divalproex (DEPAKOTE) 500 MG DR tablet Take 500 mg by mouth daily.    Yes [provider]  pantoprazole (PROTONIX) 40 MG tablet Take 40 mg by mouth 2 (two) times daily.    Yes [provider]    Allergies as of 09/09/2019  . (No Known Allergies)    Family History  Problem Relation Age of Onset  . Colon cancer Neg Hx   . Colon polyps Neg Hx     Social History   Socioeconomic History  . Marital status: Single    Spouse name: Not on file  . Number of children: Not on file  . Years of education: Not on file  . Highest education level: Not on file  Occupational History  . Not on file  Tobacco Use  . Smoking status: Former Smoker    Packs/day: 1.00    Years: 17.00    Pack years: 17.00    Types: Cigarettes    Start date: 82    Quit date: 2020    Years since quitting: 1.3  . Smokeless tobacco: Never Used  . Tobacco comment: vapes  Substance and Sexual Activity  . Alcohol use: Yes    Comment: 12 pack every other weekend, possibly a  few during the week   . Drug use: Never  . Sexual activity: Yes  Other Topics Concern  . Not on file  Social History Narrative  . Not on file   Social Determinants of Health   Financial Resource Strain:   . Difficulty of Paying Living Expenses:   Food Insecurity:   . Worried About 2021 in the Last Year:   . Programme researcher, broadcasting/film/video in the Last Year:   Transportation Needs:   . Barista (Medical):   Freight forwarder Lack of Transportation (Non-Medical):   Physical Activity:   . Days of Exercise per Week:   . Minutes of Exercise per Session:   Stress:   . Feeling of Stress :   Social Connections:   . Frequency of Communication with Friends and Family:   . Frequency of Social Gatherings with Friends and Family:   . Attends Religious Services:   . Active Member of Clubs or Organizations:   . Attends Marland [employer] Meetings:   Banker Marital Status:   Intimate Partner Violence:   . Fear of Current or Ex-Partner:   . Emotionally Abused:   [place] [employer] Physically Abused:   . Sexually Abused:  Review of Systems: See HPI, otherwise negative ROS  Physical Exam: BP 129/80   Pulse 64   Temp 97.6 F (36.4 C) (Oral)   Resp 12   Ht 6\' 2"  (1.88 m)   Wt 95.3 kg   SpO2 99%   BMI 26.96 kg/m  General:   Alert,  Well-developed, well-nourished, pleasant and cooperative in NAD Neck:  Supple; no masses or thyromegaly. No significant cervical adenopathy. Lungs:  Clear throughout to auscultation.   No wheezes, crackles, or rhonchi. No acute distress. Heart:  Regular rate and rhythm; no murmurs, clicks, rubs,  or gallops. Abdomen: Non-distended, normal bowel sounds.  Soft and nontender without appreciable mass or hepatosplenomegaly.  Pulses:  Normal pulses noted. Extremities:  Without clubbing or edema.  Impression/Plan: 44 year old with longstanding GERD and esophageal dysphagia.  Here for an EGD. The risks, benefits, limitations, alternatives and imponderables have been reviewed with  the patient. Potential for esophageal dilation, biopsy, etc. have also been reviewed.  Questions have been answered. All parties agreeable.     Notice: This dictation was prepared with Dragon dictation along with smaller phrase technology. Any transcriptional errors that result from this process are unintentional and may not be corrected upon review.

## 2019-11-24 NOTE — Care Management (Signed)
Simple adenoma removed from his colon recently.  Please let him know; I do not recommend a future colonoscopy unless new symptoms develop.

## 2019-11-24 NOTE — Anesthesia Preprocedure Evaluation (Signed)
Anesthesia Evaluation  Patient identified by MRN, date of birth, ID band Patient awake    Reviewed: Allergy & Precautions, H&P , NPO status , Patient's Chart, lab work & pertinent test results, reviewed documented beta blocker date and time   Airway Mallampati: II  TM Distance: >3 FB Neck ROM: full    Dental no notable dental hx.    Pulmonary neg pulmonary ROS, former smoker,    Pulmonary exam normal breath sounds clear to auscultation       Cardiovascular Exercise Tolerance: Good negative cardio ROS   Rhythm:regular Rate:Normal     Neuro/Psych negative neurological ROS  negative psych ROS   GI/Hepatic negative GI ROS, Neg liver ROS,   Endo/Other  negative endocrine ROS  Renal/GU negative Renal ROS  negative genitourinary   Musculoskeletal   Abdominal   Peds  Hematology negative hematology ROS (+)   Anesthesia Other Findings   Reproductive/Obstetrics negative OB ROS                             Anesthesia Physical Anesthesia Plan  ASA: II  Anesthesia Plan: General   Post-op Pain Management:    Induction:   PONV Risk Score and Plan: 2 and Propofol infusion  Airway Management Planned:   Additional Equipment:   Intra-op Plan:   Post-operative Plan:   Informed Consent: I have reviewed the patients History and Physical, chart, labs and discussed the procedure including the risks, benefits and alternatives for the proposed anesthesia with the patient or authorized representative who has indicated his/her understanding and acceptance.     Dental Advisory Given  Plan Discussed with: CRNA  Anesthesia Plan Comments:         Anesthesia Quick Evaluation

## 2019-11-24 NOTE — Op Note (Signed)
[facility] Patient Name: Harold Weaver Procedure Date: 11/24/2019 10:08 AM MRN: [id number] Date of Birth: 02-Dec-1975 Attending MD: Gennette Pac , MD CSN: [id number] Age: 44 Admit Type: Outpatient Procedure:                Upper GI endoscopy Indications:              Dysphagia, Heartburn Providers:                Gennette Pac, MD, Jannett Celestine, RN, Edythe Clarity, Technician Referring MD:             Truddie Coco Medicines:                Propofol per Anesthesia Complications:            No immediate complications. Estimated Blood Loss:     Estimated blood loss: none. Procedure:                Pre-Anesthesia Assessment:                           - Prior to the procedure, a History and Physical                            was performed, and patient medications and                            allergies were reviewed. The patient's tolerance of                            previous anesthesia was also reviewed. The risks                            and benefits of the procedure and the sedation                            options and risks were discussed with the patient.                            All questions were answered, and informed consent                            was obtained. Prior Anticoagulants: The patient has                            taken no previous anticoagulant or antiplatelet                            agents. ASA Grade Assessment: II - A patient with                            mild systemic disease. After reviewing the risks  and benefits, the patient was deemed in                            satisfactory condition to undergo the procedure.                           After obtaining informed consent, the endoscope was                            passed under direct vision. Throughout the                            procedure, the patient's blood pressure, pulse, and                            oxygen  saturations were monitored continuously. The                            GIF-H190 ([id number]) scope was introduced through the                            mouth, and advanced to the second part of duodenum.                            The upper GI endoscopy was accomplished without                            difficulty. The patient tolerated the procedure                            well. Scope In: 10:29:39 AM Scope Out: 10:34:18 AM Total Procedure Duration: 0 hours 4 minutes 39 seconds  Findings:      Esophagitis was found. 1.5 cm linear erosion straddling the GE junction.       No Barrett's epithelium or tumor seen. Tubular esophagus was patent       throughout its course The scope was withdrawn. Dilation was performed       with a Maloney dilator with mild resistance at 56 Fr. The dilation site       was examined following endoscope reinsertion and showed no change.       Estimated blood loss: none.      The entire examined stomach was normal.      The duodenal bulb and second portion of the duodenum were normal. Impression:               -Erosive reflux esophagitis. That is post [facility]                            dilation                           - Normal stomach.                           - Normal duodenal bulb and second portion of the  duodenum.                           - No specimens collected. Moderate Sedation:      Moderate (conscious) sedation was personally administered by an       anesthesia professional. The following parameters were monitored: oxygen       saturation, heart rate, blood pressure, respiratory rate, EKG, adequacy       of pulmonary ventilation, and response to care. Recommendation:           - Patient has a contact number available for                            emergencies. The signs and symptoms of potential                            delayed complications were discussed with the                            patient. Return to normal  activities tomorrow.                            Written discharge instructions were provided to the                            patient.                           - Continue present medications. Stop Protonix as                            patient reports it is not helping. Begin Dexilant                            60 mg daily. Prescription provided. Office visit                            with [place] in 6 weeks Procedure Code(s):        --- Professional ---                           [phone], Esophagogastroduodenoscopy, flexible,                            transoral; diagnostic, including collection of                            specimen(s) by brushing or washing, when performed                            (separate procedure)                           25956, Dilation of esophagus, by unguided sound or  bougie, single or multiple passes Diagnosis Code(s):        --- Professional ---                           K20.90, Esophagitis, unspecified without bleeding                           R13.10, Dysphagia, unspecified                           R12, Heartburn CPT copyright 2019 American Medical Association. All rights reserved. The codes documented in this report are preliminary and upon coder review may  be revised to meet current compliance requirements. Gerrit Friends. Amaree Loisel, MD Gennette Pac, MD 11/24/2019 10:42:59 AM This report has been signed electronically. Number of Addenda: 0

## 2019-12-21 ENCOUNTER — Ambulatory Visit: Payer: BC Managed Care – PPO | Admitting: Gastroenterology

## 2020-02-07 ENCOUNTER — Encounter: Payer: Self-pay | Admitting: Gastroenterology

## 2020-02-07 ENCOUNTER — Other Ambulatory Visit: Payer: Self-pay

## 2020-02-07 ENCOUNTER — Ambulatory Visit: Payer: BC Managed Care – PPO | Admitting: Gastroenterology

## 2020-02-07 VITALS — BP 125/73 | HR 72 | Temp 97.7°F | Ht 74.0 in | Wt 208.0 lb

## 2020-02-07 DIAGNOSIS — R0989 Other specified symptoms and signs involving the circulatory and respiratory systems: Secondary | ICD-10-CM | POA: Diagnosis not present

## 2020-02-07 DIAGNOSIS — K219 Gastro-esophageal reflux disease without esophagitis: Secondary | ICD-10-CM | POA: Insufficient documentation

## 2020-02-07 DIAGNOSIS — K2100 Gastro-esophageal reflux disease with esophagitis, without bleeding: Secondary | ICD-10-CM | POA: Diagnosis not present

## 2020-02-07 NOTE — Progress Notes (Signed)
Referring Provider: Truddie Coco, FNP Primary Care Physician:  Harold Coco, FNP Primary GI; Dr. Jena Weaver   Chief Complaint  Patient presents with   globus sensation    noticed difference 1 day after esophagus was stretched   Dysphagia    HPI:   Harold Weaver is a 44 y.o. male presenting today with a history of GERD, globus sensation, dysphagia, s/p EGD recently with erosive reflux esophagitis, s/p dilation, normal stomach, normal duodenum.  Solid food dysphagia resolved. Globus sensation persists. Dexilant daily. Has significant GERD symptoms still. Takes old reflux medication and it goes away. Dexilant helped at first but now has breakthrough. GERD can be so bad he notices it radiating into his chest. Has Protonix at home still. Sees ENT Specialist in [place] soon for deviated septum. One energy drink per day. Changed diet. Fruits, [place]. Goes to bed at night after eating and not waiting several hours.   Left-sided lower discomfort intermittently. No N/V. No fever or chills. Unknown triggers. No relieving factors. Lasts about 20 minutes or so. No constipation or diarrhea.   Past Medical History:  Diagnosis Date   Arthritis    Unspecified mood (affective) disorder (HCC)     Past Surgical History:  Procedure Laterality Date   ESOPHAGOGASTRODUODENOSCOPY (EGD) WITH PROPOFOL N/A 11/24/2019   Erosive reflux esophagitis, s/p dilation, normal stomach, normal duodenum.    MALONEY DILATION N/A 11/24/2019   Procedure: Harold Weaver DILATION;  Surgeon: Harold Ade, MD;  Location: [facility];  Service: Endoscopy;  Laterality: N/A;   None     TONSILLECTOMY      Current Outpatient Medications  Medication Sig Dispense Refill   Azelastine HCl 0.15 % SOLN Place 1 spray into both nostrils 2 (two) times daily as needed for rhinitis or allergies. Use in each nostril as directed      Cholecalciferol (HM VITAMIN D3) 50 MCG (2000 UT) CAPS Take 2,000 Units by mouth daily.       DEXILANT 60 MG capsule Take 1 capsule by mouth daily.     divalproex (DEPAKOTE) 500 MG DR tablet Take 500 mg by mouth daily.      No current facility-administered medications for this visit.    Allergies as of 02/07/2020   (No Known Allergies)    Family History  Problem Relation Age of Onset   Colon cancer Neg Hx    Colon polyps Neg Hx     Social History   Socioeconomic History   Marital status: Single    Spouse name: Not on file   Number of children: Not on file   Years of education: Not on file   Highest education level: Not on file  Occupational History   Not on file  Tobacco Use   Smoking status: Former Smoker    Packs/day: 1.00    Years: 17.00    Pack years: 17.00    Types: Cigarettes    Start date: 31    Quit date: 2020    Years since quitting: 1.5   Smokeless tobacco: Never Used   Tobacco comment: vapes  Vaping Use   Vaping Use: Every day   Substances: Nicotine   Devices: 1 cartridge per week  Substance and Sexual Activity   Alcohol use: Yes    Comment: 12 pack every other weekend, possibly a few during the week    Drug use: Never   Sexual activity: Yes  Other Topics Concern   Not on file  Social History Narrative  Not on file   Social Determinants of Health   Financial Resource Strain:    Difficulty of Paying Living Expenses:   Food Insecurity:    Worried About Programme researcher, broadcasting/film/video in the Last Year:    Barista in the Last Year:   Transportation Needs:    Freight forwarder (Medical):    Lack of Transportation (Non-Medical):   Physical Activity:    Days of Exercise per Week:    Minutes of Exercise per Session:   Stress:    Feeling of Stress :   Social Connections:    Frequency of Communication with Friends and Family:    Frequency of Social Gatherings with Friends and Family:    Attends Religious Services:    Active Member of Clubs or Organizations:    Attends Banker  Meetings:    Marital Status:     Review of Systems: Gen: Denies fever, chills, anorexia. Denies fatigue, weakness, weight loss.  CV: Denies chest pain, palpitations, syncope, peripheral edema, and claudication. Resp: Denies dyspnea at rest, cough, wheezing, coughing up blood, and pleurisy. GI: see HPI Derm: Denies rash, itching, dry skin Psych: Denies depression, anxiety, memory loss, confusion. No homicidal or suicidal ideation.  Heme: Denies bruising, bleeding, and enlarged lymph nodes.  Physical Exam: BP 125/73    Pulse 72    Temp 97.7 F (36.5 C) (Oral)    Ht 6\' 2"  (1.88 m)    Wt 208 lb (94.3 kg)    BMI 26.71 kg/m  General:   Alert and oriented. No distress noted. Pleasant and cooperative.  Head:  Normocephalic and atraumatic. Eyes:  Conjuctiva clear without scleral icterus. Mouth:  Mask in place Abdomen:  +BS, soft, non-tender and non-distended. No rebound or guarding. No HSM or masses noted. Msk:  Symmetrical without gross deformities. Normal posture. Extremities:  Without edema. Neurologic:  Alert and  oriented x4 Psych:  Alert and cooperative. Normal mood and affect.  ASSESSMENT: Harold Weaver is a 44 y.o. male presenting today with history of GERD, erosive esophagitis, dysphagia improved s/p dilation and persistent globus sensation.   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 Dexilant initially was helpful but now with exacerbations. Will resume Protonix but at BID dosing and add Pepcid as needed before bed. He is established with ENT, and I have asked that he discuss globus sensation with them at upcoming appt.   LLQ discomfort fleeting, no exacerbating or relieving factors, and seems musculoskeletal. He is to call if any worsening, and we will pursue a CT. No association with bowel habits, and he has no concerning lower GI  signs/symptoms.    PLAN:   Stop Dexilant  Start Protonix BID: call if this is helpful  Pepcid in evening  Follow-up with ENT  Call if worsening LLQ pain and will pursue CT  6 month return  11-08-1994, PhD, ANP-BC [facility]

## 2020-02-07 NOTE — Patient Instructions (Addendum)
For now, stop Dexilant. Restart Protonix but take 30 minutes before first and last meal of the day. 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 You can add pepcid (over-the-counter) at bedtime.   Please mention to ENT the "globus sensation" you have. I believe it's from chronic reflux irritation, but they can look into this further.  Let me know if no improvement. If the Protonix works well for you, let me know so I can send in a prescription. There are other medications to try if needed.  We will see you in 6 months!  I enjoyed seeing you again today! As you know, I value our relationship and want to provide genuine, compassionate, and quality care. I welcome your feedback. If you receive a survey regarding your visit,  I greatly appreciate you taking time to fill this out. See you next time!  Gelene Mink, PhD, ANP-BC [facility]   Food Choices for Gastroesophageal Reflux Disease, Adult When you have gastroesophageal reflux disease (GERD), the foods you eat and your eating habits are very important. Choosing the right foods can help ease the discomfort of GERD. Consider working with a diet and nutrition specialist (dietitian) to help you make healthy food choices. What general guidelines should I follow?  Eating plan  Choose healthy foods low in fat, such as fruits, vegetables, whole grains, low-fat dairy products, and lean meat, fish, and poultry.  Eat frequent, small meals instead of three large meals each day. Eat your meals slowly, in a relaxed setting. Avoid bending over or lying down until 2-3 hours after eating.  Limit high-fat foods such as fatty meats or fried foods.  Limit your intake of oils, butter, and shortening to less than 8 teaspoons each day.  Avoid the following: ? Foods that cause symptoms. These may be different for different people. Keep a food diary to keep track of foods that cause symptoms. ? Alcohol. ? Drinking large amounts of liquid with  meals. ? Eating meals during the 2-3 hours before bed.  Cook foods using methods other than frying. This may include baking, grilling, or broiling. Lifestyle  Maintain a healthy weight. Ask your health care provider what weight is healthy for you. If you need to lose weight, work with your health care provider to do so safely.  Exercise for at least 30 minutes on 5 or more days each week, or as told by your health care provider.  Avoid wearing clothes that fit tightly around your waist and chest.  Do not use any products that contain nicotine or tobacco, such as cigarettes and e-cigarettes. If you need help quitting, ask your health care provider.  Sleep with the head of your bed raised. Use a wedge under the mattress or blocks under the bed frame to raise the head of the bed. What foods are not recommended? The items listed may not be a complete list. Talk with your dietitian about what dietary choices are best for you. Grains Pastries or quick breads with added fat. [place] toast. Vegetables Deep fried vegetables. [place] fries. Any vegetables prepared with added fat. Any vegetables that cause symptoms. For some people this may include tomatoes and tomato products, chili peppers, onions and garlic, and horseradish. Fruits Any fruits prepared with added fat. Any fruits that cause symptoms. For some people this may include citrus fruits, such as oranges, grapefruit, pineapple, and lemons. Meats and other protein foods High-fat meats, such as fatty beef or pork, hot dogs, ribs, ham, sausage, salami and bacon.  Fried meat or protein, including fried fish and fried chicken. Nuts and nut butters. Dairy Whole milk and chocolate milk. Sour cream. Cream. Ice cream. Cream cheese. Milk shakes. Beverages Coffee and tea, with or without caffeine. Carbonated beverages. Sodas. Energy drinks. Fruit juice made with acidic fruits (such as orange or grapefruit). Tomato juice. Alcoholic drinks. Fats and  oils Butter. Margarine. Shortening. Ghee. Sweets and desserts Chocolate and cocoa. Donuts. Seasoning and other foods Pepper. Peppermint and spearmint. Any condiments, herbs, or seasonings that cause symptoms. For some people, this may include curry, hot sauce, or vinegar-based salad dressings. Summary  When you have gastroesophageal reflux disease (GERD), food and lifestyle choices are very important to help ease the discomfort of GERD.  Eat frequent, small meals instead of three large meals each day. Eat your meals slowly, in a relaxed setting. Avoid bending over or lying down until 2-3 hours after eating.  Limit high-fat foods such as fatty meat or fried foods. This information is not intended to replace advice given to you by your health care provider. Make sure you discuss any questions you have with your health care provider. Document Revised: 11/11/2018 Document Reviewed: 07/22/2016 Elsevier Patient Education  2020 [employer].

## 2020-06-22 IMAGING — CT CT MAXILLOFACIAL W/O CM
1 series · 9 of 30 positions shown, 12 images · non-contrast
Comparison: None.

CLINICAL DATA: Chronic sinusitis. Headaches. Sinus pressure.
Deviated septum.

EXAM:
CT MAXILLOFACIAL WITHOUT CONTRAST
TECHNIQUE: Multidetector CT images of the paranasal sinuses were obtained using
the standard protocol without intravenous contrast.

[Series 8: sag soft · sagittal · 0.23mm/px · 9 of 103 slices shown, 12 images]
[im 8/103  brain]
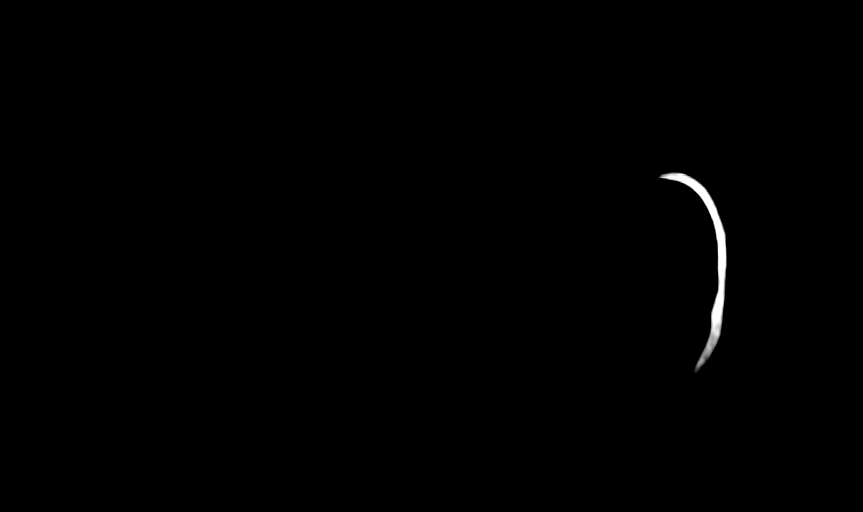
[im 8/103  bone]
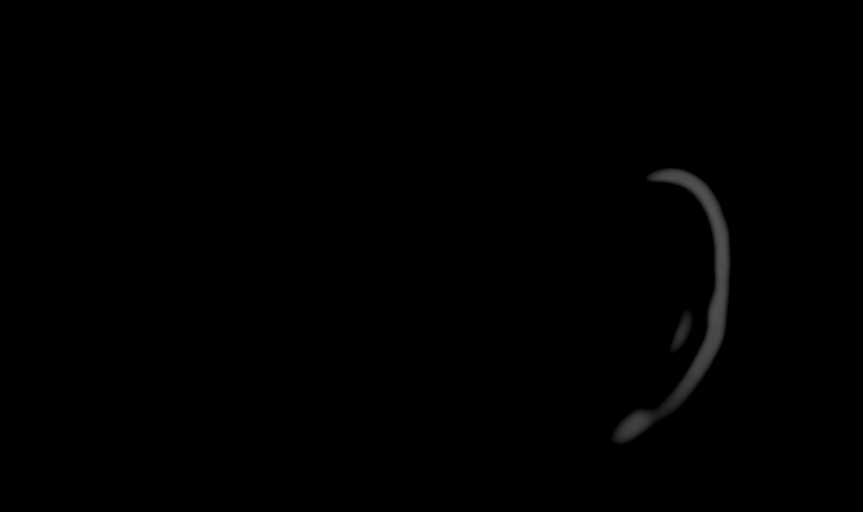
[im 18/103  bone]
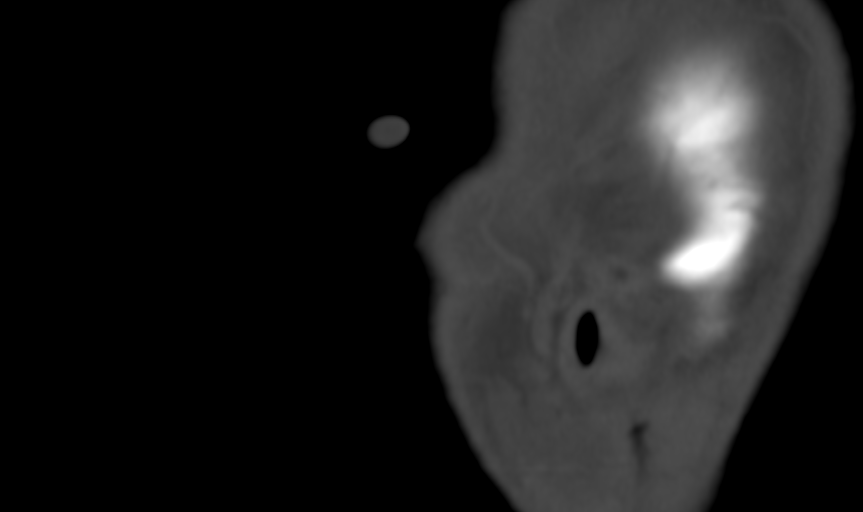
[im 29/103  bone]
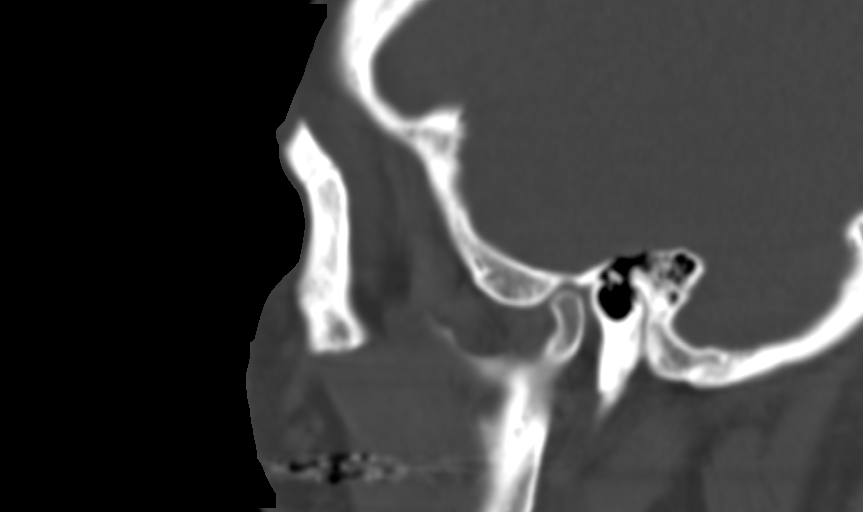
[im 39/103  bone]
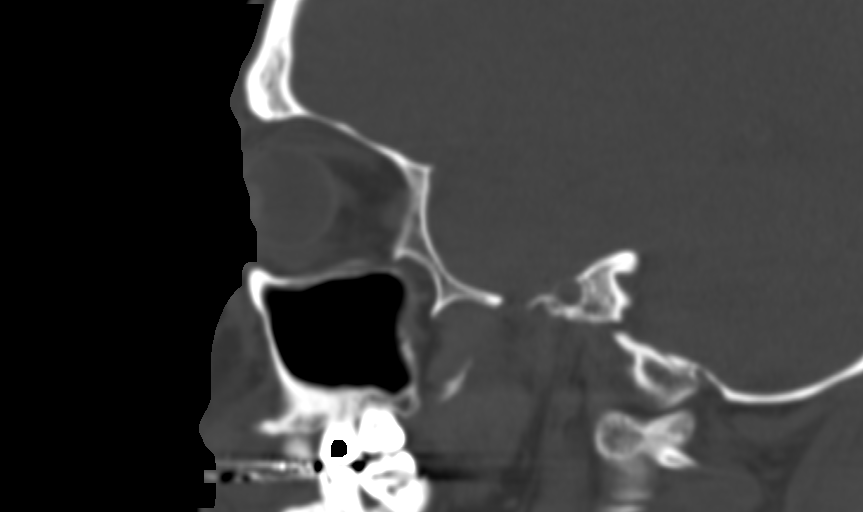
[im 53/103  brain]
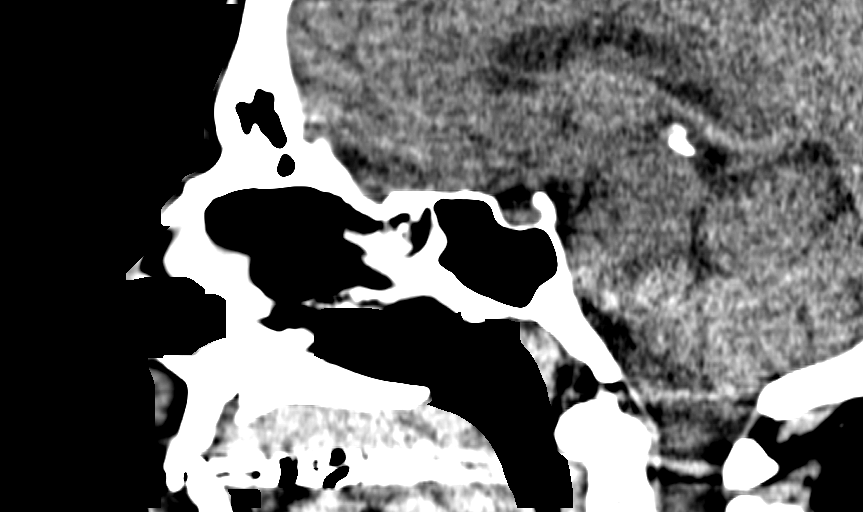
[im 53/103  bone]
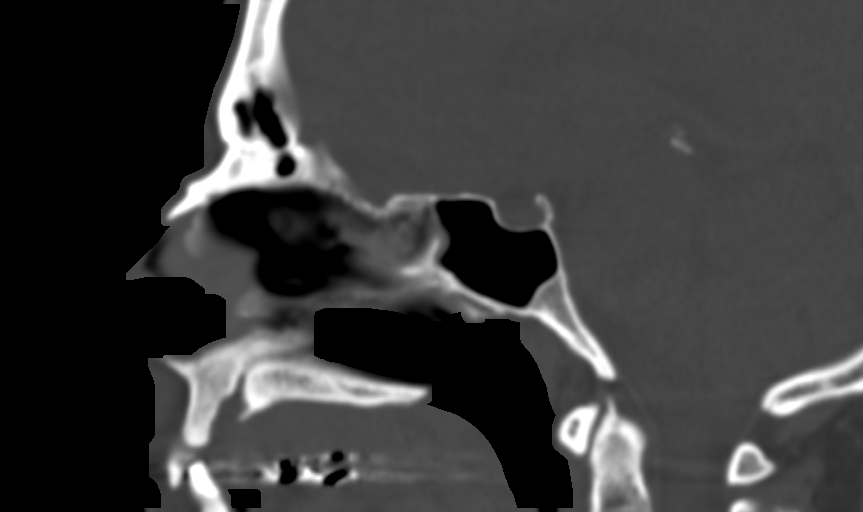
[im 64/103  bone]
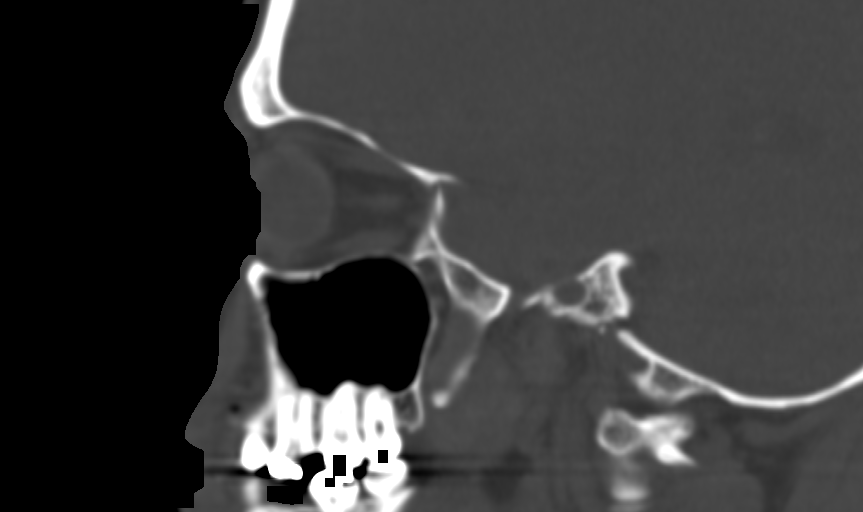
[im 74/103  bone]
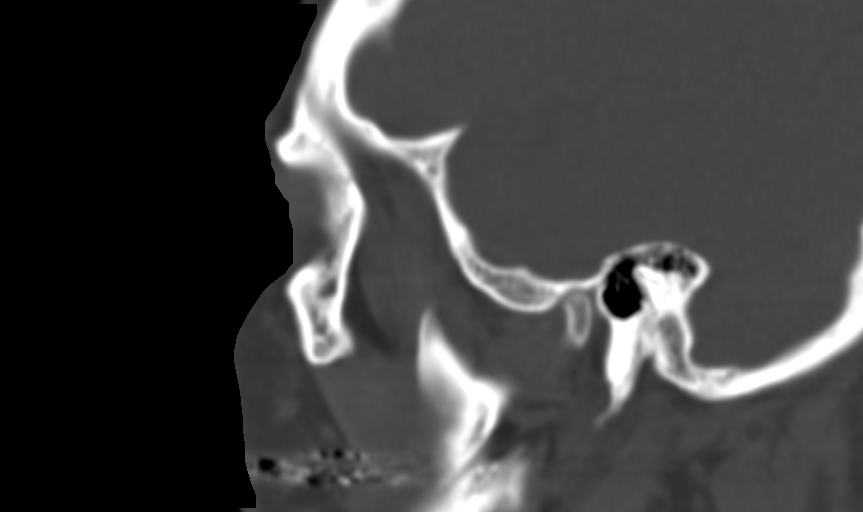
[im 85/103  bone]
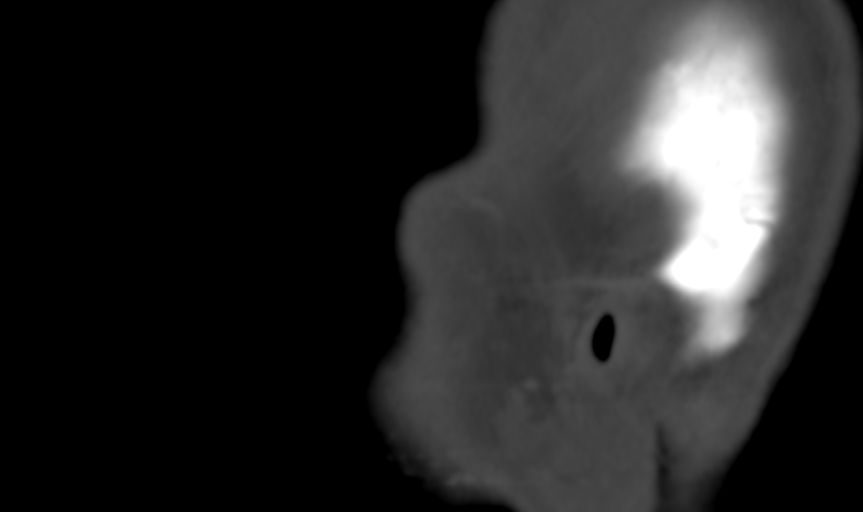
[im 95/103  brain]
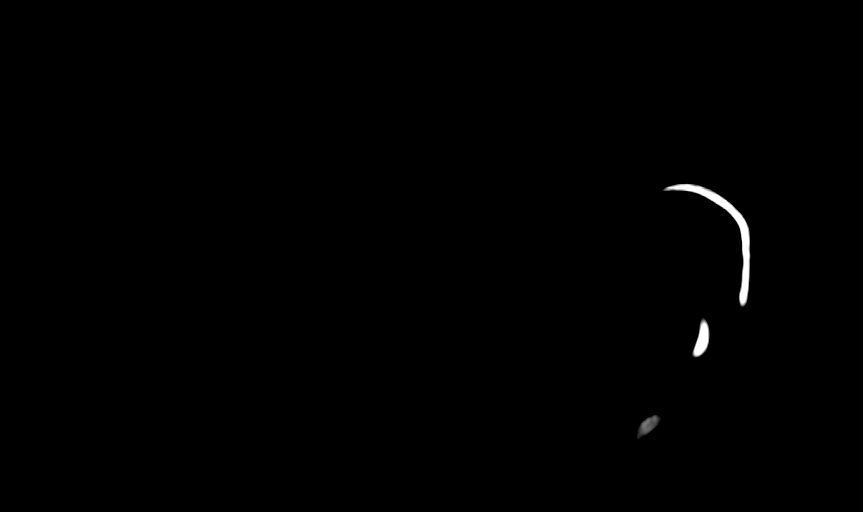
[im 95/103  bone]
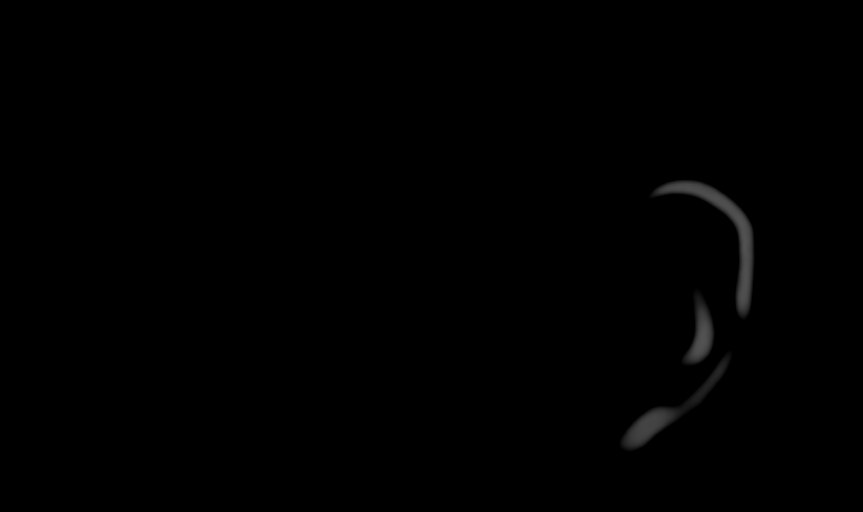

[9 of 30 positions shown; findings below may reference images not displayed]

FINDINGS: Paranasal sinuses:

Frontal: Normally aerated. Patent frontal sinus drainage pathways.

Ethmoid: Minimal mucosal thickening bilaterally.

Maxillary: Minimal mucosal thickening bilaterally.

Sphenoid: Minimal mucosal thickening in the left sphenoid sinus with
effacement of the sinus ostium. Patent sphenoethmoidal recesses.

Right ostiomeatal unit: Patent.

Left ostiomeatal unit: Patent.

Nasal passages: Paradoxical rotation of the middle turbinates.
Asymmetric right middle and inferior nasal turbinate hypertrophy and
4 mm rightward deviation of an intact nasal septum narrow the right
nasal cavity.

Anatomy: No pneumatization superior to anterior ethmoid notches.
Keros II. Sellar sphenoid pneumatization pattern. No dehiscence of
carotid or optic canals. No onodi cell.

Other: Clear mastoid air cells and tympanic cavities. Unremarkable
appearance of the orbits and included portion of the brain.
IMPRESSION: 1. Minimal scattered paranasal sinus mucosal thickening. No fluid.
2. Rightward nasal septal deviation and right-sided nasal turbinate
hypertrophy.

## 2020-08-09 ENCOUNTER — Ambulatory Visit: Payer: BC Managed Care – PPO | Admitting: Gastroenterology
